# Patient Record
Sex: Male | Born: 2000 | Race: White | Hispanic: No | Marital: Single | Smoking: Never smoker
Health system: Southern US, Community
[De-identification: ages and names within clinical notes are randomized; demographics above are authoritative.]

## PROBLEM LIST (undated history)

## (undated) DIAGNOSIS — N200 Calculus of kidney: Secondary | ICD-10-CM

## (undated) DIAGNOSIS — R51 Headache: Secondary | ICD-10-CM

## (undated) DIAGNOSIS — T7840XA Allergy, unspecified, initial encounter: Secondary | ICD-10-CM

## (undated) DIAGNOSIS — K589 Irritable bowel syndrome without diarrhea: Secondary | ICD-10-CM

## (undated) HISTORY — DX: Calculus of kidney: N20.0

## (undated) HISTORY — DX: Headache: R51

## (undated) HISTORY — DX: Irritable bowel syndrome, unspecified: K58.9

## (undated) HISTORY — DX: Allergy, unspecified, initial encounter: T78.40XA

---
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
## 2018-02-17 IMAGING — US US ABDOMEN LIMITED
1 series · 14 of 20 positions shown · non-contrast
Comparison: None.

CLINICAL DATA: Right lower quadrant pain beginning this afternoon.
Vomiting 3 times.

EXAM:
LIMITED ABDOMINAL ULTRASOUND
TECHNIQUE: Gray scale imaging of the right lower quadrant was performed to
evaluate for suspected appendicitis. Standard imaging planes and
graded compression technique were utilized.

[Series 1: us abdomen limited · 0.10mm/px · 14 of 20 slices shown]
[im 1/20]
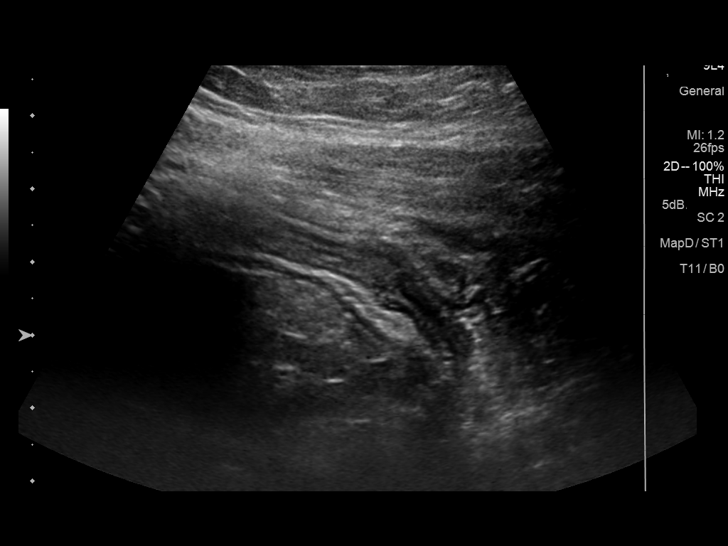
[im 3/20]
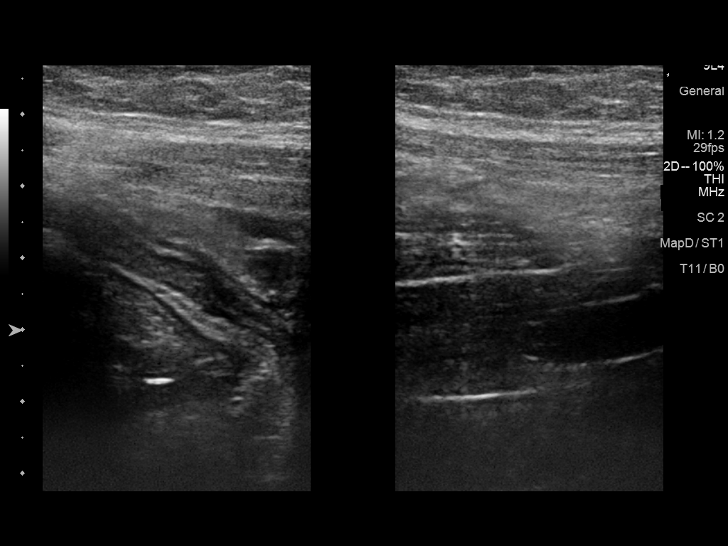
[im 4/20]
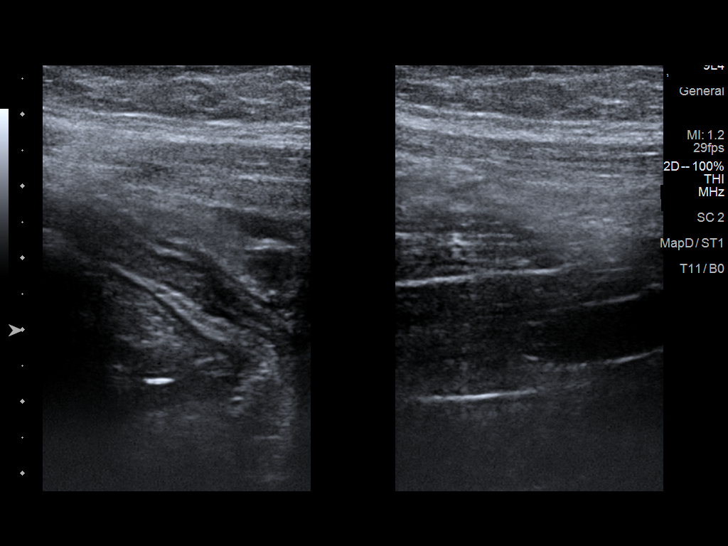
[im 6/20]
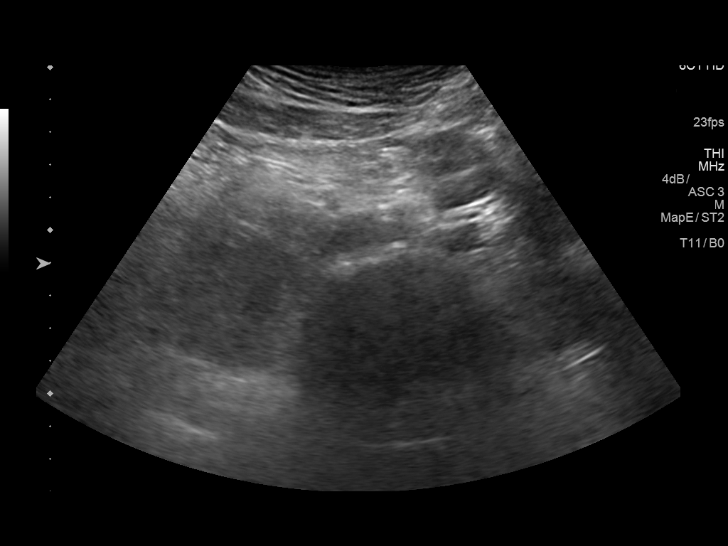
[im 7/20]
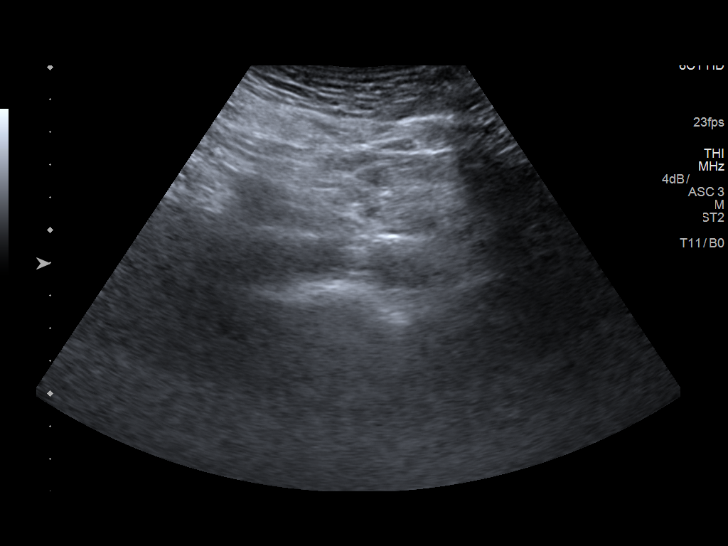
[im 8/20]
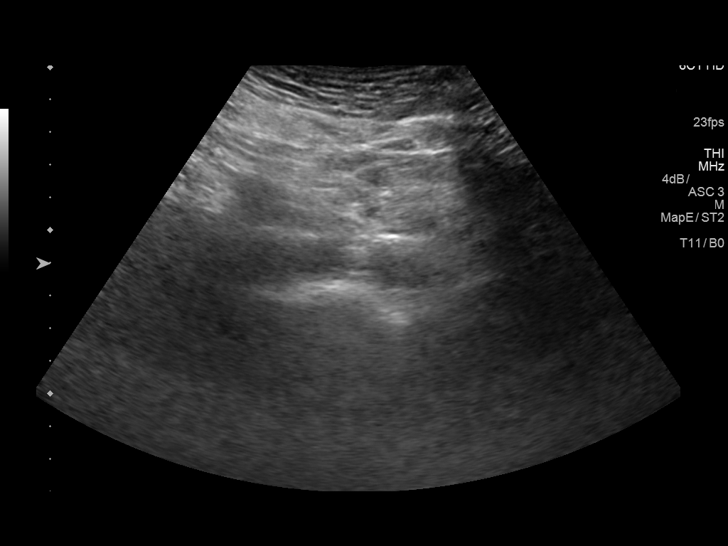
[im 10/20]
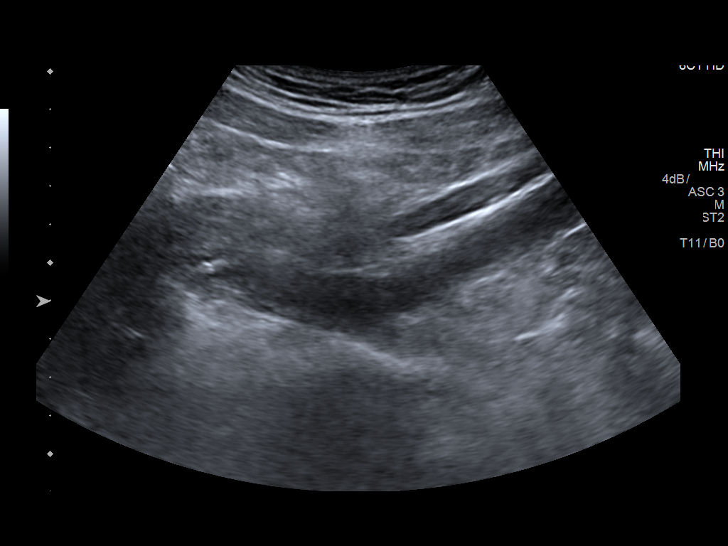
[im 11/20]
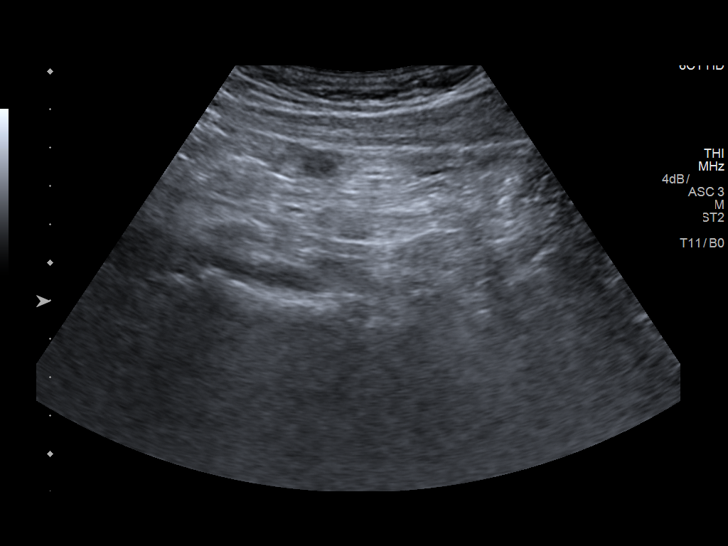
[im 13/20]
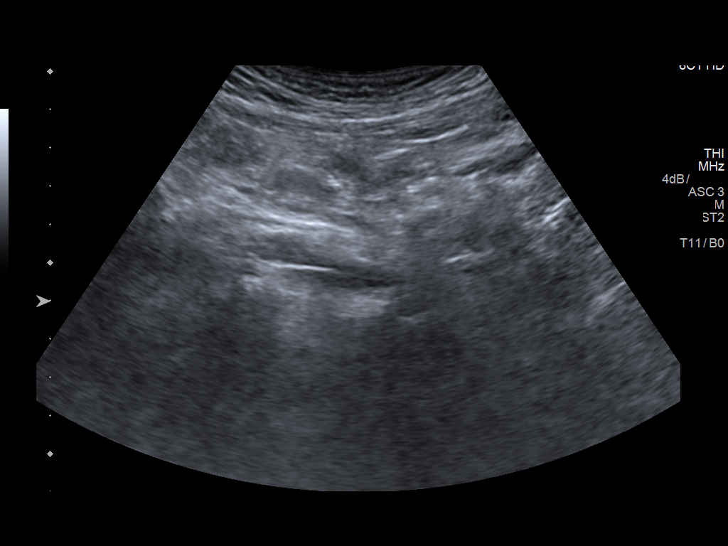
[im 14/20]
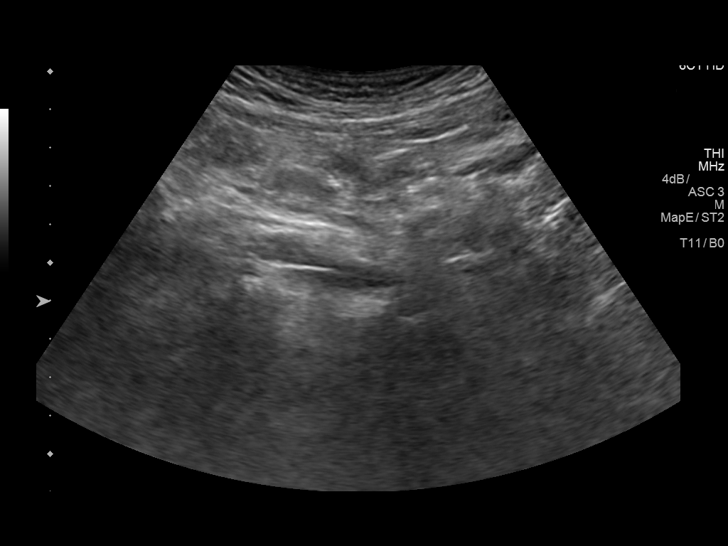
[im 16/20]
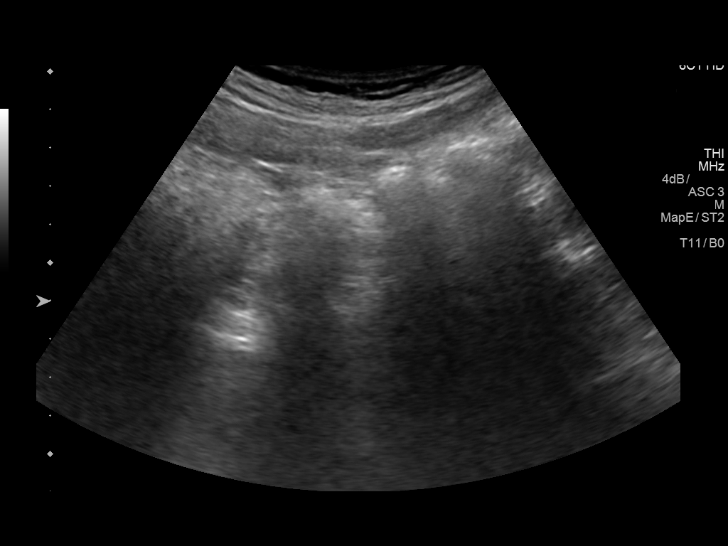
[im 17/20]
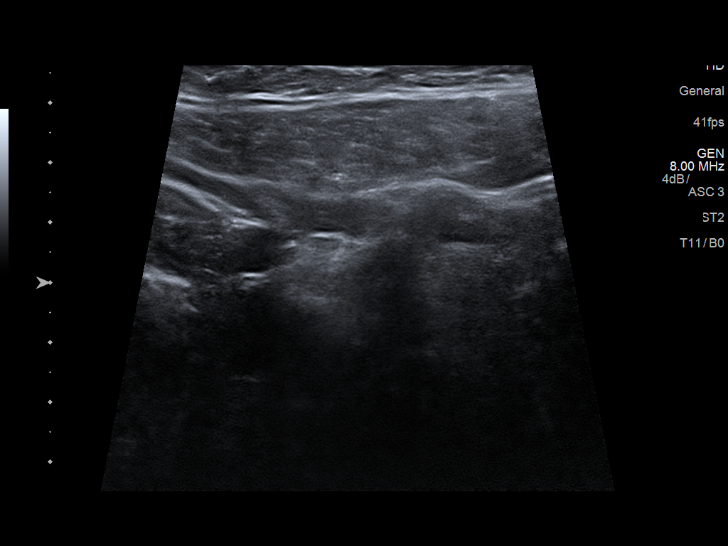
[im 18/20]
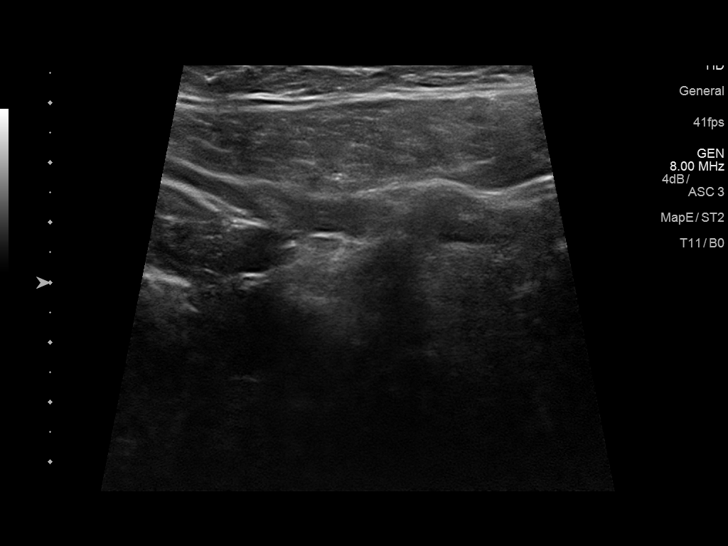
[im 20/20]
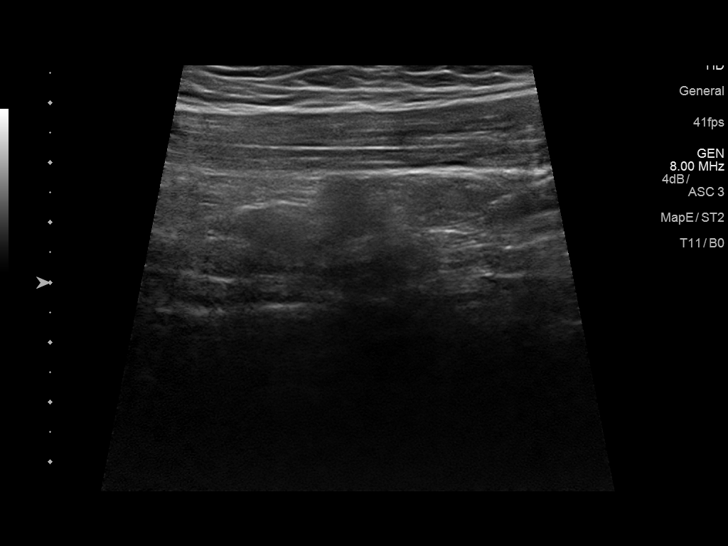

[14 of 20 positions shown; findings below may reference images not displayed]

FINDINGS: The appendix is not visualized.

Ancillary findings: None.

Factors affecting image quality: Patient's body habitus somewhat
limits assessment for the appendix.
IMPRESSION: No evidence of acute appendicitis.  Normal appendix not visualized.

Note: Non-visualization of appendix by US does not definitely
exclude appendicitis. If there is sufficient clinical concern,
consider abdomen pelvis CT with contrast for further evaluation.

## 2018-02-28 ENCOUNTER — Telehealth: Payer: Self-pay

## 2018-02-28 NOTE — Telephone Encounter (Signed)
Copied from CRM [phone]. Topic: Inquiry >> Feb 28, 2018 11:15 AM Eston Mouldavis, Cheri B wrote: Reason for CRM: PT's mother called to see if paperwork for pts colleges  was sent  , she says it is ok to leave voicemail

## 2018-03-01 NOTE — Telephone Encounter (Signed)
I have it, I haven't had time to get to it yet.  I'll work on it.  Thanks.

## 2018-03-04 NOTE — Telephone Encounter (Signed)
Mom advised, form faxed, copied for scanning, original placed at front desk for pickup.

## 2018-03-15 ENCOUNTER — Ambulatory Visit: Payer: BLUE CROSS/BLUE SHIELD | Admitting: Family Medicine

## 2018-05-07 ENCOUNTER — Telehealth: Payer: Self-pay | Admitting: Family Medicine

## 2018-05-07 NOTE — Telephone Encounter (Signed)
Patient's mother,Ellen, came by and said patient is starting [employer] tomorrow and they just e-mailed today that his immunizations are incomplete because the school's name isn't on the immunizations.  They're asking for the doctor's office to fax the immunization records to them today.  The fax number is [phone].

## 2018-05-07 NOTE — Telephone Encounter (Signed)
I faxed a copy of immunizations to [employer] and gave a copy to patient's mother.

## 2018-05-07 NOTE — Telephone Encounter (Signed)
Printed out immunization and given [place].

## 2018-07-09 DIAGNOSIS — J0140 Acute pansinusitis, unspecified: Secondary | ICD-10-CM | POA: Diagnosis not present

## 2018-07-09 DIAGNOSIS — S80861A Insect bite (nonvenomous), right lower leg, initial encounter: Secondary | ICD-10-CM | POA: Diagnosis not present

## 2018-07-09 DIAGNOSIS — R110 Nausea: Secondary | ICD-10-CM | POA: Diagnosis not present

## 2018-07-10 DIAGNOSIS — R110 Nausea: Secondary | ICD-10-CM | POA: Diagnosis not present

## 2018-09-27 ENCOUNTER — Ambulatory Visit: Payer: BLUE CROSS/BLUE SHIELD | Admitting: Family Medicine

## 2018-09-27 ENCOUNTER — Encounter: Payer: Self-pay | Admitting: Family Medicine

## 2018-09-27 DIAGNOSIS — Z029 Encounter for administrative examinations, unspecified: Secondary | ICD-10-CM | POA: Diagnosis not present

## 2018-09-27 NOTE — Patient Instructions (Signed)
You should be up to date on required vaccines.  Thanks for getting a flu shot.  Check on insurance coverage about HPV and meningococcal B series.  You may be able to get them at student health.  Take care.  Glad to see you.  Good luck with school.

## 2018-09-27 NOTE — Progress Notes (Signed)
Starting at [employer] in spring 2020.  Registered for classes, taking gen ed classes initially.  Staying on campus.    No FCNAVD.  Feeling well o/w.  He had been living in [place] with his parents in the meantime.   He had a flu shot done ~06/2018 at pharmacy.    Routine vaccines and school forms reviewed with patient.  See avs.   Meds, vitals, and allergies reviewed.   ROS: Per HPI unless specifically indicated in ROS section   GEN: nad, alert and oriented HEENT: mucous membranes moist NECK: supple w/o LA CV: rrr.  no murmur PULM: ctab, no inc wob ABD: soft, +bs EXT: no edema SKIN: no acute rash but acne noted on the face.

## 2018-09-29 DIAGNOSIS — Z029 Encounter for administrative examinations, unspecified: Secondary | ICD-10-CM | POA: Insufficient documentation

## 2018-09-29 NOTE — Assessment & Plan Note (Signed)
Forms for school discussed.  He is up-to-date on required vaccination.  Discussed additional meningococcal vaccine and HPV vaccine.  See after visit summary.  Routine cautions given.  Update me as needed.  He agrees.

## 2020-05-28 DIAGNOSIS — R030 Elevated blood-pressure reading, without diagnosis of hypertension: Secondary | ICD-10-CM | POA: Diagnosis not present

## 2020-05-28 DIAGNOSIS — R1030 Lower abdominal pain, unspecified: Secondary | ICD-10-CM | POA: Diagnosis not present
